# Patient Record
Sex: Female | Born: 1965 | Race: White | Hispanic: No | Marital: Married
Health system: Southern US, Community
[De-identification: ages and names within clinical notes are randomized; demographics above are authoritative.]

---

## 2005-06-15 ENCOUNTER — Ambulatory Visit: Payer: Self-pay | Admitting: Family Medicine

## 2006-05-24 ENCOUNTER — Ambulatory Visit: Payer: Self-pay | Admitting: Obstetrics and Gynecology

## 2006-06-02 ENCOUNTER — Ambulatory Visit: Payer: Self-pay | Admitting: Obstetrics and Gynecology

## 2006-07-05 ENCOUNTER — Ambulatory Visit: Payer: Self-pay | Admitting: Obstetrics and Gynecology

## 2007-07-09 ENCOUNTER — Ambulatory Visit: Payer: Self-pay | Admitting: Obstetrics and Gynecology

## 2008-08-20 ENCOUNTER — Ambulatory Visit: Payer: Self-pay | Admitting: Obstetrics and Gynecology

## 2009-08-26 ENCOUNTER — Ambulatory Visit: Payer: Self-pay | Admitting: Obstetrics and Gynecology

## 2010-10-27 ENCOUNTER — Ambulatory Visit: Payer: Self-pay | Admitting: Obstetrics and Gynecology

## 2011-12-06 ENCOUNTER — Ambulatory Visit: Payer: Self-pay | Admitting: Obstetrics and Gynecology

## 2012-12-06 ENCOUNTER — Ambulatory Visit: Payer: Self-pay | Admitting: Obstetrics and Gynecology

## 2013-12-19 ENCOUNTER — Ambulatory Visit: Payer: Self-pay | Admitting: Obstetrics and Gynecology

## 2014-09-26 ENCOUNTER — Other Ambulatory Visit: Payer: Self-pay | Admitting: Obstetrics and Gynecology

## 2014-09-26 DIAGNOSIS — Z1239 Encounter for other screening for malignant neoplasm of breast: Secondary | ICD-10-CM

## 2014-12-23 ENCOUNTER — Ambulatory Visit
Admission: RE | Admit: 2014-12-23 | Discharge: 2014-12-23 | Disposition: A | Discharge status: Home / Self Care | Payer: BLUE CROSS/BLUE SHIELD | Source: Ambulatory Visit | Attending: Obstetrics and Gynecology | Admitting: Obstetrics and Gynecology

## 2014-12-23 DIAGNOSIS — Z1231 Encounter for screening mammogram for malignant neoplasm of breast: Secondary | ICD-10-CM | POA: Diagnosis not present

## 2014-12-23 DIAGNOSIS — Z1239 Encounter for other screening for malignant neoplasm of breast: Secondary | ICD-10-CM

## 2015-10-06 ENCOUNTER — Other Ambulatory Visit: Payer: Self-pay | Admitting: Obstetrics and Gynecology

## 2015-10-06 DIAGNOSIS — Z1231 Encounter for screening mammogram for malignant neoplasm of breast: Secondary | ICD-10-CM

## 2015-12-24 ENCOUNTER — Ambulatory Visit
Admission: RE | Admit: 2015-12-24 | Discharge: 2015-12-24 | Disposition: A | Discharge status: Home / Self Care | Payer: BLUE CROSS/BLUE SHIELD | Source: Ambulatory Visit | Attending: Obstetrics and Gynecology | Admitting: Obstetrics and Gynecology

## 2015-12-24 DIAGNOSIS — Z1231 Encounter for screening mammogram for malignant neoplasm of breast: Secondary | ICD-10-CM | POA: Insufficient documentation

## 2016-11-01 ENCOUNTER — Other Ambulatory Visit: Payer: Self-pay | Admitting: Obstetrics and Gynecology

## 2016-11-01 DIAGNOSIS — Z1231 Encounter for screening mammogram for malignant neoplasm of breast: Secondary | ICD-10-CM

## 2016-12-26 ENCOUNTER — Ambulatory Visit
Admission: RE | Admit: 2016-12-26 | Discharge: 2016-12-26 | Disposition: A | Discharge status: Home / Self Care | Payer: BLUE CROSS/BLUE SHIELD | Source: Ambulatory Visit | Attending: Obstetrics and Gynecology | Admitting: Obstetrics and Gynecology

## 2016-12-26 DIAGNOSIS — Z1231 Encounter for screening mammogram for malignant neoplasm of breast: Secondary | ICD-10-CM | POA: Diagnosis present

## 2017-11-07 ENCOUNTER — Other Ambulatory Visit: Payer: Self-pay | Admitting: Obstetrics and Gynecology

## 2017-11-07 DIAGNOSIS — Z1231 Encounter for screening mammogram for malignant neoplasm of breast: Secondary | ICD-10-CM

## 2017-12-28 ENCOUNTER — Ambulatory Visit
Admission: RE | Admit: 2017-12-28 | Discharge: 2017-12-28 | Disposition: A | Discharge status: Home / Self Care | Payer: BLUE CROSS/BLUE SHIELD | Source: Ambulatory Visit | Attending: Obstetrics and Gynecology | Admitting: Obstetrics and Gynecology

## 2017-12-28 DIAGNOSIS — Z1231 Encounter for screening mammogram for malignant neoplasm of breast: Secondary | ICD-10-CM | POA: Diagnosis present

## 2018-11-13 ENCOUNTER — Other Ambulatory Visit: Payer: Self-pay | Admitting: Obstetrics and Gynecology

## 2018-11-13 DIAGNOSIS — Z1231 Encounter for screening mammogram for malignant neoplasm of breast: Secondary | ICD-10-CM

## 2019-12-10 ENCOUNTER — Other Ambulatory Visit: Payer: Self-pay

## 2019-12-10 ENCOUNTER — Ambulatory Visit
Admission: RE | Admit: 2019-12-10 | Discharge: 2019-12-10 | Disposition: A | Discharge status: Home / Self Care | Payer: BC Managed Care – PPO | Source: Ambulatory Visit | Attending: Obstetrics and Gynecology | Admitting: Obstetrics and Gynecology

## 2019-12-10 DIAGNOSIS — Z1231 Encounter for screening mammogram for malignant neoplasm of breast: Secondary | ICD-10-CM | POA: Insufficient documentation

## 2020-12-09 ENCOUNTER — Other Ambulatory Visit: Payer: Self-pay | Admitting: Obstetrics and Gynecology

## 2020-12-09 DIAGNOSIS — Z1231 Encounter for screening mammogram for malignant neoplasm of breast: Secondary | ICD-10-CM

## 2020-12-25 ENCOUNTER — Ambulatory Visit
Admission: RE | Admit: 2020-12-25 | Discharge: 2020-12-25 | Disposition: A | Discharge status: Home / Self Care | Payer: BC Managed Care – PPO | Source: Ambulatory Visit | Attending: Obstetrics and Gynecology | Admitting: Obstetrics and Gynecology

## 2020-12-25 ENCOUNTER — Other Ambulatory Visit: Payer: Self-pay

## 2020-12-25 DIAGNOSIS — Z1231 Encounter for screening mammogram for malignant neoplasm of breast: Secondary | ICD-10-CM | POA: Insufficient documentation

## 2021-12-20 ENCOUNTER — Emergency Department: Payer: BC Managed Care – PPO

## 2021-12-20 ENCOUNTER — Other Ambulatory Visit: Payer: Self-pay

## 2021-12-20 DIAGNOSIS — R42 Dizziness and giddiness: Secondary | ICD-10-CM | POA: Insufficient documentation

## 2021-12-20 LAB — URINALYSIS, ROUTINE W REFLEX MICROSCOPIC
Bilirubin Urine: NEGATIVE
Glucose, UA: NEGATIVE mg/dL
Hgb urine dipstick: NEGATIVE
Ketones, ur: 5 mg/dL — AB
Leukocytes,Ua: NEGATIVE
Nitrite: NEGATIVE
Protein, ur: NEGATIVE mg/dL
Specific Gravity, Urine: 1.02 (ref 1.005–1.030)
pH: 5 (ref 5.0–8.0)

## 2021-12-20 LAB — BASIC METABOLIC PANEL
Anion gap: 7 (ref 5–15)
BUN: 13 mg/dL (ref 6–20)
CO2: 27 mmol/L (ref 22–32)
Calcium: 9.2 mg/dL (ref 8.9–10.3)
Chloride: 103 mmol/L (ref 98–111)
Creatinine, Ser: 0.64 mg/dL (ref 0.44–1.00)
GFR, Estimated: >60 mL/min (ref >60)
Glucose, Bld: 120 mg/dL — ABNORMAL HIGH (ref 70–99)
Potassium: 3.4 mmol/L — ABNORMAL LOW (ref 3.5–5.1)
Sodium: 137 mmol/L (ref 135–145)

## 2021-12-20 LAB — CBC
HCT: 37.9 % (ref 36.0–46.0)
Hemoglobin: 12.4 g/dL (ref 12.0–15.0)
MCH: 28.6 pg (ref 26.0–34.0)
MCHC: 32.7 g/dL (ref 30.0–36.0)
MCV: 87.3 fL (ref 80.0–100.0)
Platelets: 317 10*3/uL (ref 150–400)
RBC: 4.34 MIL/uL (ref 3.87–5.11)
RDW: 12.4 % (ref 11.5–15.5)
WBC: 12.6 10*3/uL — ABNORMAL HIGH (ref 4.0–10.5)
nRBC: 0 % (ref 0.0–0.2)

## 2021-12-20 LAB — TROPONIN I (HIGH SENSITIVITY)
Troponin I (High Sensitivity): 4 ng/L (ref <18)
Troponin I (High Sensitivity): 4 ng/L (ref <18)

## 2021-12-20 MED ORDER — ONDANSETRON HCL 4 MG/2ML IJ SOLN: 4.0000 mg | Freq: Once | INTRAMUSCULAR | Status: DC

## 2021-12-20 NOTE — ED Triage Notes (Signed)
Pt arrives with c/o dizziness that started earlier this evening. Per pt, after the dizziness started she had an episode of bloody vomit. Pt denies CP or SOB.

## 2021-12-20 NOTE — ED Provider Triage Note (Signed)
  Emergency Medicine Provider Triage Evaluation Note  Brandi Barnes , a 56 y.o.female,  was evaluated in triage.  Pt complains of dizziness.  Patient reports a sudden onset of room spinning sensation that started earlier this evening, associated with nausea/vomiting.  She does state that over episodes of, appeared to have blood in.  She states that this is never happened before.  Denies any significant pain at this time.   Review of Systems  Positive: Dizziness, nausea/vomiting Negative: Denies fever, chest pain, abdominal pain  Physical Exam   Vitals:   12/20/21 1923  BP: (!) 177/92  Pulse: 80  Resp: 19  Temp: 97.7 F (36.5 C)  SpO2: 99%   Gen:   Awake, appears distressed. Resp:  Normal effort  MSK:   Moves extremities without difficulty  Other:    Medical Decision Making  Given the patient's initial medical screening exam, the following diagnostic evaluation has been ordered. The patient will be placed in the appropriate treatment space, once one is available, to complete the evaluation and treatment. I have discussed the plan of care with the patient and I have advised the patient that an ED physician or mid-level practitioner will reevaluate their condition after the test results have been received, as the results may give them additional insight into the type of treatment they may need.    Diagnostics: Labs, head CT, urinalysis, EKG.  Treatments: none immediately   Teodoro Spray, [place] 12/20/21 1939

## 2021-12-20 NOTE — ED Notes (Signed)
Pt unable to give urine sample at this time.  Pt has cup and is aware of the need for one

## 2021-12-21 ENCOUNTER — Emergency Department
Admission: EM | Admit: 2021-12-21 | Discharge: 2021-12-21 | Disposition: A | Discharge status: Home / Self Care | Payer: BC Managed Care – PPO | Attending: Emergency Medicine | Admitting: Emergency Medicine

## 2021-12-21 DIAGNOSIS — R42 Dizziness and giddiness: Secondary | ICD-10-CM

## 2021-12-21 MED ORDER — MECLIZINE HCL 25 MG PO TABS: 25.0000 mg | ORAL_TABLET | Freq: Three times a day (TID) | ORAL | 0 refills | Status: AC | PRN

## 2021-12-21 MED ORDER — MECLIZINE HCL 25 MG PO TABS
50.0000 mg | ORAL_TABLET | Freq: Once | ORAL | Status: AC
  Administered 2021-12-21: 50 mg via ORAL
  Filled 2021-12-21: qty 2

## 2021-12-21 MED ORDER — ONDANSETRON 4 MG PO TBDP: 4.0000 mg | ORAL_TABLET | Freq: Four times a day (QID) | ORAL | 0 refills | Status: AC | PRN

## 2021-12-21 MED ORDER — SODIUM CHLORIDE 0.9 % IV BOLUS (SEPSIS)
1000.0000 mL | Freq: Once | INTRAVENOUS | Status: AC
  Administered 2021-12-21: 1000 mL via INTRAVENOUS

## 2021-12-21 NOTE — ED Notes (Signed)
Pt c/o dizziness that started this evening. Pt states when she gets up the room is spinning.

## 2021-12-21 NOTE — ED Notes (Signed)
Pt verbalized understanding of discharge instructions, prescriptions, and follow-up care instructions. Pt advised if symptoms worsen to return to ED. E-signature not available due to signature pad not working.  

## 2021-12-21 NOTE — ED Provider Notes (Signed)
[facility] Provider Note    Event Date/Time   First MD Initiated Contact with Patient 12/21/21 0122     (approximate)   History   Dizziness   HPI  Brandi Barnes is a 56 y.o. female with no significant past medical history who presents to the emergency department with vertigo that started tonight while talking to her daughter.  Symptoms worse with standing up but feels like the room is spinning and she feels unsteady on her feet.  She is having some headache but no head injury.  She did have several episodes of vomiting and started seeing streaks of blood in her vomit but not vomiting any clots and not on blood thinners.  No numbness, tingling or weakness.  No ear pain, tinnitus or hearing loss.  Has never had vertigo before.  No history of previous stroke.   History provided by patient, husband, daughter.    History reviewed. No pertinent past medical history.  History reviewed. No pertinent surgical history.  MEDICATIONS:  Prior to Admission medications   Not on File    Physical Exam   Triage Vital Signs: ED Triage Vitals  Enc Vitals Group     BP 12/20/21 1923 (!) 177/92     Pulse Rate 12/20/21 1923 80     Resp 12/20/21 1923 19     Temp 12/20/21 1923 97.7 F (36.5 C)     Temp Source 12/20/21 1923 Oral     SpO2 12/20/21 1923 99 %     Weight 12/20/21 1925 185 lb (83.9 kg)     Height 12/20/21 1925 5\' 4"  (1.626 m)     Head Circumference --      Peak Flow --      Pain Score 12/20/21 1925 0     Pain Loc --      Pain Edu? --      Excl. in GC? --     Most recent vital signs: Vitals:   12/21/21 0200 12/21/21 0307  BP: (!) 151/86 (!) 158/79  Pulse: 78 65  Resp: 16 16  Temp:  97.8 F (36.6 C)  SpO2: 100% 100%    CONSTITUTIONAL: Alert and oriented and responds appropriately to questions. Well-appearing; well-nourished HEAD: Normocephalic, atraumatic EYES: Conjunctivae clear, pupils appear equal, sclera nonicteric ENT: normal nose;  moist mucous membranes; TMs are clear bilaterally without erythema, purulence, bulging, perforation, effusion.  No cerumen impaction or sign of foreign body in the external auditory canal. No inflammation, erythema or drainage from the external auditory canal. No signs of mastoiditis. No pain with manipulation of the pinna bilaterally. NECK: Supple, normal ROM CARD: RRR; S1 and S2 appreciated; no murmurs, no clicks, no rubs, no gallops RESP: Normal chest excursion without splinting or tachypnea; breath sounds clear and equal bilaterally; no wheezes, no rhonchi, no rales, no hypoxia or respiratory distress, speaking full sentences ABD/GI: Normal bowel sounds; non-distended; soft, non-tender, no rebound, no guarding, no peritoneal signs BACK: The back appears normal EXT: Normal ROM in all joints; no deformity noted, no edema; no cyanosis SKIN: Normal color for age and race; warm; no rash on exposed skin NEURO: Moves all extremities equally, normal speech, no dysmetria to finger-nose testing, no drift, sensation to light touch intact diffusely, cranial nerves II through XII intact PSYCH: The patient's mood and manner are appropriate.   ED Results / Procedures / Treatments   LABS: (all labs ordered are listed, but only abnormal results are displayed) Labs Reviewed  BASIC METABOLIC  PANEL - Abnormal; Notable for the following components:      Result Value   Potassium 3.4 (*)    Glucose, Bld 120 (*)    All other components within normal limits  CBC - Abnormal; Notable for the following components:   WBC 12.6 (*)    All other components within normal limits  URINALYSIS, ROUTINE W REFLEX MICROSCOPIC - Abnormal; Notable for the following components:   Color, Urine YELLOW (*)    APPearance CLEAR (*)    Ketones, ur 5 (*)    All other components within normal limits  TROPONIN I (HIGH SENSITIVITY)  TROPONIN I (HIGH SENSITIVITY)     EKG:  EKG Interpretation  Date/Time:  Monday December 20 2021  19:30:55 EDT Ventricular Rate:  76 PR Interval:  130 QRS Duration: 86 QT Interval:  398 QTC Calculation: 447 R Axis:   50 Text Interpretation: Normal sinus rhythm Normal ECG No previous ECGs available Confirmed by Pryor Curia [phone]) on 12/21/2021 1:26:21 AM         RADIOLOGY: My personal review and interpretation of imaging: CT head unremarkable.  Chest x-ray clear.  I have personally reviewed all radiology reports.   DG Chest 2 View  Result Date: 12/20/2021 CLINICAL DATA:  Hematemesis EXAM: CHEST - 2 VIEW COMPARISON:  None Available. FINDINGS: Lungs are clear.  No pleural effusion or pneumothorax. The heart is normal in size. Visualized osseous structures are within normal limits. IMPRESSION: Normal chest radiographs. Electronically Signed   By: Julian Hy M.D.   On: 12/20/2021 23:29   CT Head Wo Contrast  Result Date: 12/20/2021 CLINICAL DATA:  Dizziness EXAM: CT HEAD WITHOUT CONTRAST TECHNIQUE: Contiguous axial images were obtained from the base of the skull through the vertex without intravenous contrast. RADIATION DOSE REDUCTION: This exam was performed according to the departmental dose-optimization program which includes automated exposure control, adjustment of the mA and/or kV according to patient size and/or use of iterative reconstruction technique. COMPARISON:  None Available. FINDINGS: Brain: No evidence of acute infarction, hemorrhage, mass, mass effect, or midline shift. No hydrocephalus or extra-axial fluid collection. Vascular: No hyperdense vessel. Skull: Normal. Negative for fracture or focal lesion. Sinuses/Orbits: No acute finding. Other: The mastoid air cells are well aerated. IMPRESSION: No acute intracranial process. Electronically Signed   By: Merilyn Baba M.D.   On: 12/20/2021 19:50     PROCEDURES:  Critical Care performed: No    Procedures    IMPRESSION / MDM / ASSESSMENT AND PLAN / ED COURSE  I reviewed the triage vital signs and the  nursing notes.    Patient here with vertigo without other focal neurologic deficits.     DIFFERENTIAL DIAGNOSIS (includes but not limited to):   SPECT peripheral vertigo.  Low suspicion for CVA given no significant risk factors.  Doubt intracranial hemorrhage, mass.  Differential also includes electrolyte derangement, symptomatic anemia but doubt ACS, arrhythmia.  Will check orthostats.   Patient's presentation is most consistent with acute presentation with potential threat to life or bodily function.   PLAN: Patient's labs show slight leukocytosis but normal hemoglobin, electrolytes, renal function, troponin x2 negative.  Urine shows no sign of infection or significant dehydration.  CT head and chest x-ray obtained from triage and reviewed and interpreted by myself and the radiologist and show no acute abnormality.  EKG shows no ischemia, arrhythmia or interval abnormality.  We will give IV fluids, meclizine and reassess.   MEDICATIONS GIVEN IN ED: Medications  ondansetron (ZOFRAN) injection 4  mg (has no administration in time range)  sodium chloride 0.9 % bolus 1,000 mL (0 mLs Intravenous Stopped 12/21/21 0309)  meclizine (ANTIVERT) tablet 50 mg (50 mg Oral Given 12/21/21 0201)     ED COURSE: Patient reports feeling better.  Tolerating p.o. here.  Suspect her episodes of hematemesis were from Mallory-Weiss tears.  No history of alcohol abuse, esophageal varices and no abdominal pain and not on antiplatelets or anticoagulants.  She was able to ambulate without assistance here.  Will discharge with prescription of meclizine.  Again low suspicion for CVA and do not feel at this time she needs an MRI of her brain.  NIH stroke scale is 0.  She has a PCP for follow-up if needed.  Will discharge with meclizine, Zofran for home.   At this time, I do not feel there is any life-threatening condition present. I reviewed all nursing notes, vitals, pertinent previous records.  All lab and urine  results, EKGs, imaging ordered have been independently reviewed and interpreted by myself.  I reviewed all available radiology reports from any imaging ordered this visit.  Based on my assessment, I feel the patient is safe to be discharged home without further emergent workup and can continue workup as an outpatient as needed. Discussed all findings, treatment plan as well as usual and customary return precautions.  They verbalize understanding and are comfortable with this plan.  Outpatient follow-up has been provided as needed.  All questions have been answered.    CONSULTS: Admission considered but patient feeling better and work-up is reassuring.  Appropriate for outpatient management.   OUTSIDE RECORDS REVIEWED: Reviewed patient's last well woman exam with Dr. Feliberto Gottron on 11/16/2020.       FINAL CLINICAL IMPRESSION(S) / ED DIAGNOSES   Final diagnoses:  Vertigo     Rx / DC Orders   ED Discharge Orders          Ordered    meclizine (ANTIVERT) 25 MG tablet  3 times daily PRN        12/21/21 0300    ondansetron (ZOFRAN-ODT) 4 MG disintegrating tablet  Every 6 hours PRN        12/21/21 0300             Note:  This document was prepared using Dragon voice recognition software and may include unintentional dictation errors.   Haydee Jabbour, Layla Maw, DO 12/21/21 [phone]

## 2021-12-21 NOTE — ED Notes (Signed)
Swelling went down at site of IV infiltration.

## 2021-12-21 NOTE — ED Notes (Signed)
Pt ambulated with steady gait and dizziness has gotten better.

## 2022-02-15 ENCOUNTER — Other Ambulatory Visit: Payer: Self-pay | Admitting: Family Medicine

## 2022-02-15 DIAGNOSIS — Z1231 Encounter for screening mammogram for malignant neoplasm of breast: Secondary | ICD-10-CM

## 2022-03-15 ENCOUNTER — Ambulatory Visit
Admission: RE | Admit: 2022-03-15 | Discharge: 2022-03-15 | Disposition: A | Discharge status: Home / Self Care | Payer: BC Managed Care – PPO | Source: Ambulatory Visit | Attending: Family Medicine | Admitting: Family Medicine

## 2022-03-15 DIAGNOSIS — Z1231 Encounter for screening mammogram for malignant neoplasm of breast: Secondary | ICD-10-CM | POA: Insufficient documentation

## 2022-12-05 IMAGING — MG MM DIGITAL SCREENING BILAT W/ TOMO AND CAD
8 series · 8 of 24 positions shown · non-contrast
Comparison: Previous exam(s).

CLINICAL DATA: Screening.

EXAM:
DIGITAL SCREENING BILATERAL MAMMOGRAM WITH TOMOSYNTHESIS AND CAD
TECHNIQUE: Bilateral screening digital craniocaudal and mediolateral oblique
mammograms were obtained. Bilateral screening digital breast
tomosynthesis was performed. The images were evaluated with
computer-aided detection.

[R MLO synth-2D]
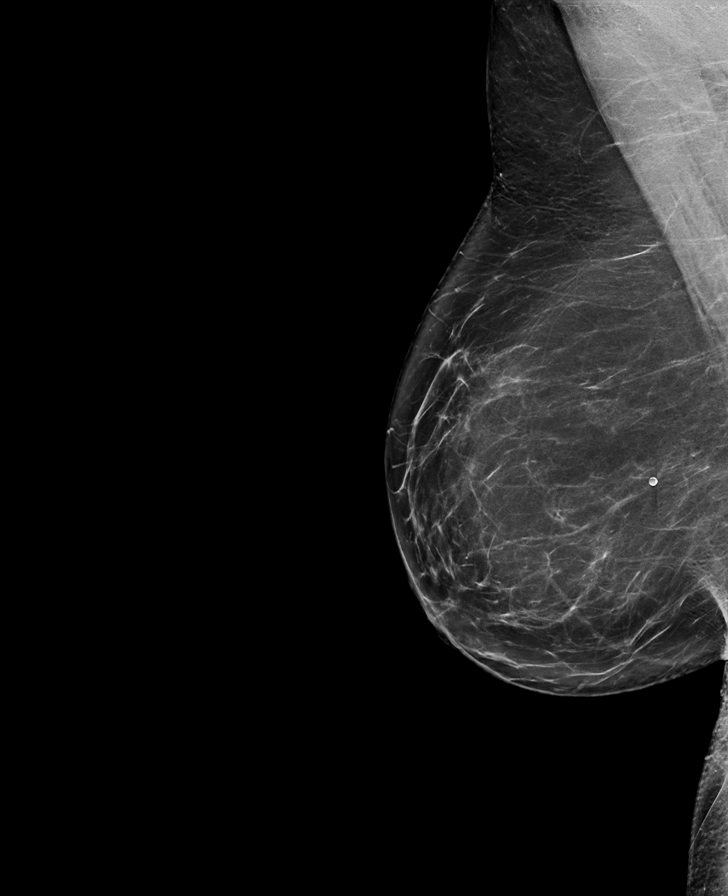

[L MLO synth-2D]
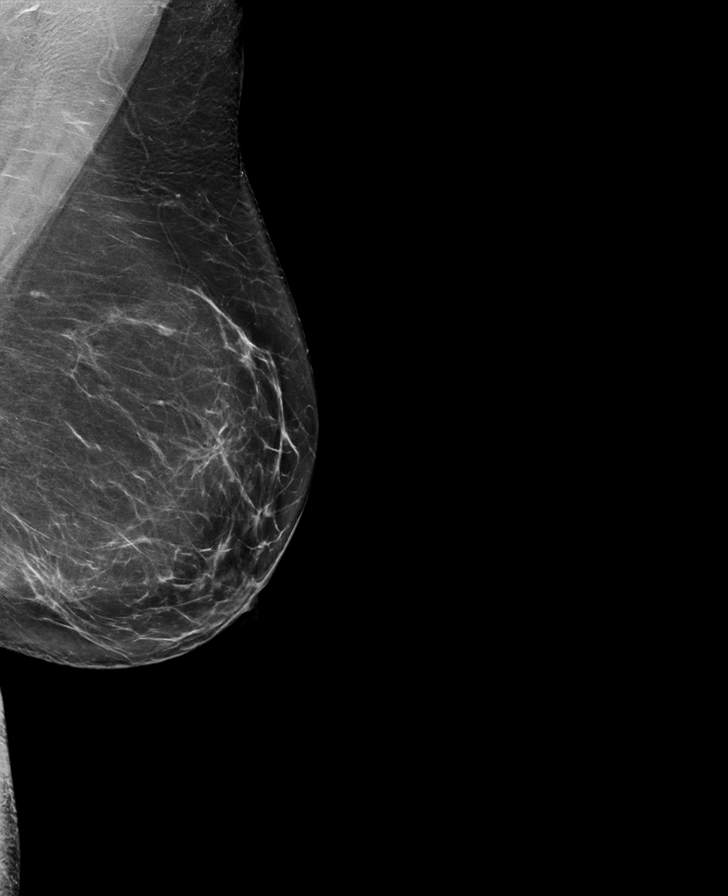

[R CC synth-2D]
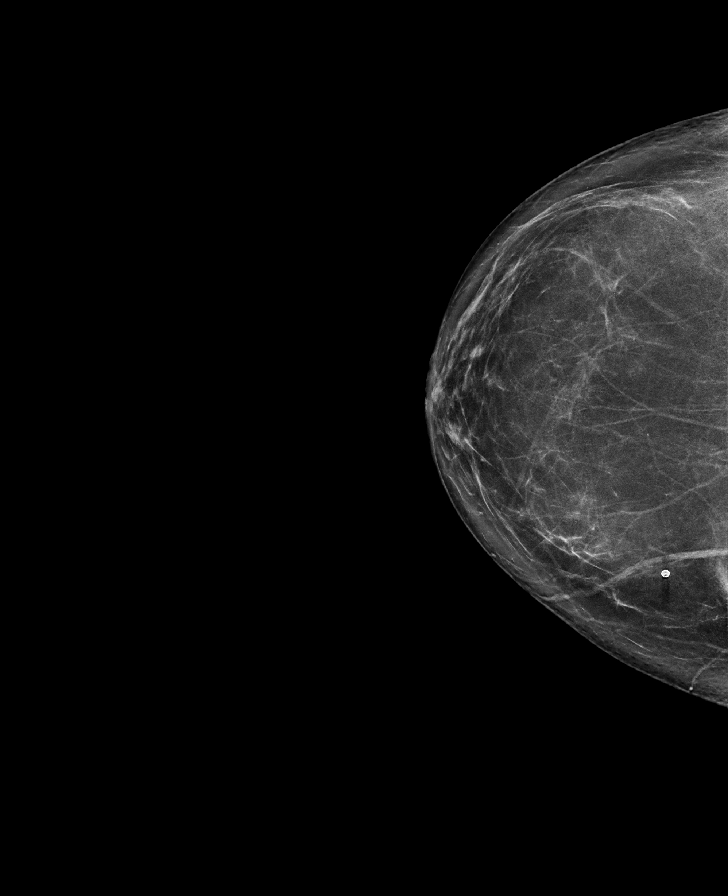

[L CC synth-2D]
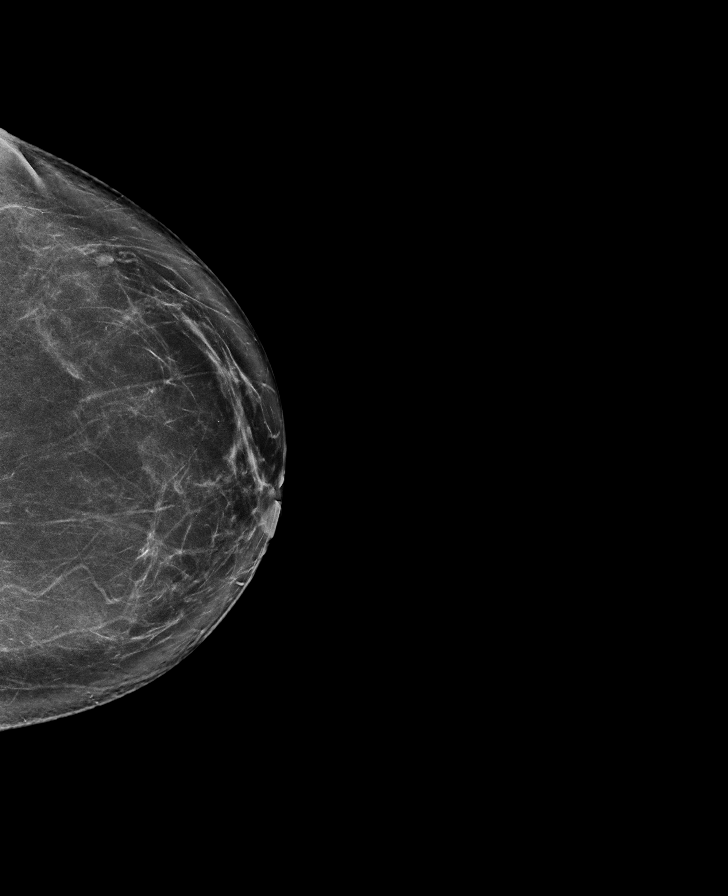

[L MLO tomo · tomo slice 44/87.0]
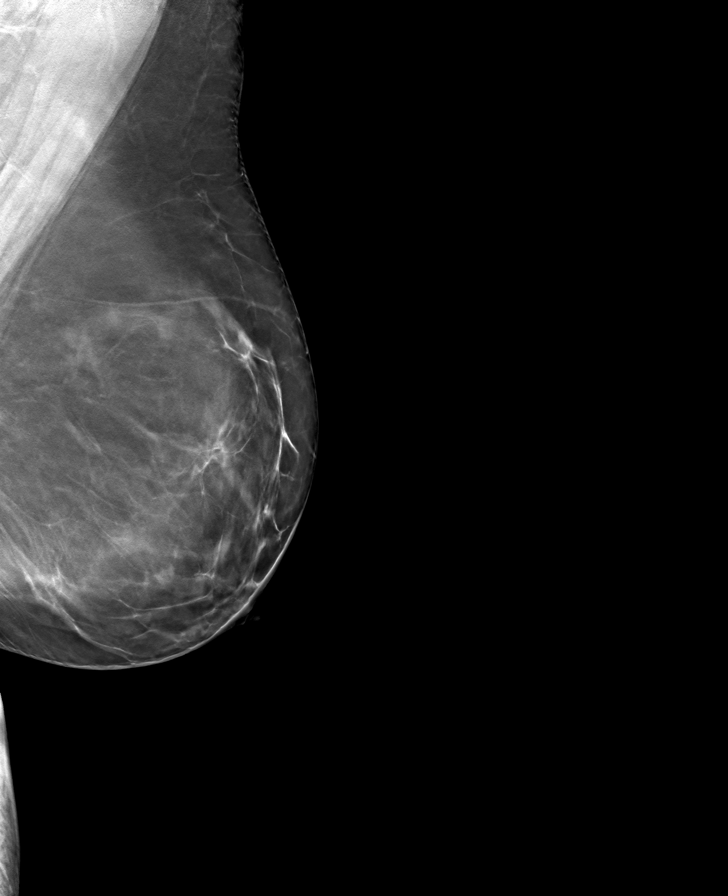

[R CC tomo · tomo slice 41/82.0]
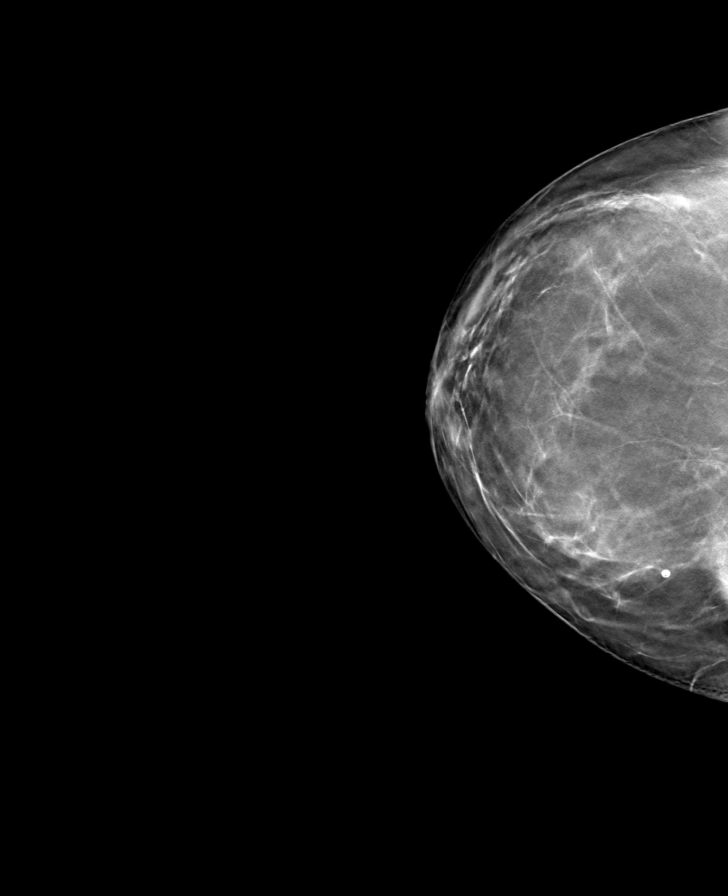

[R MLO tomo · tomo slice 45/88.0]
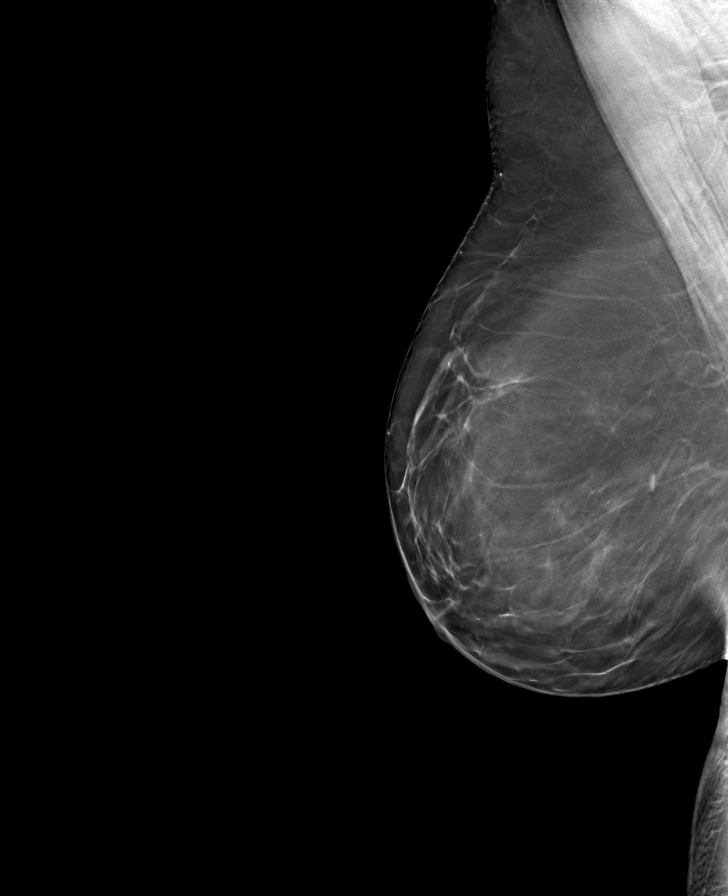

[L CC tomo · tomo slice 43/84.0]
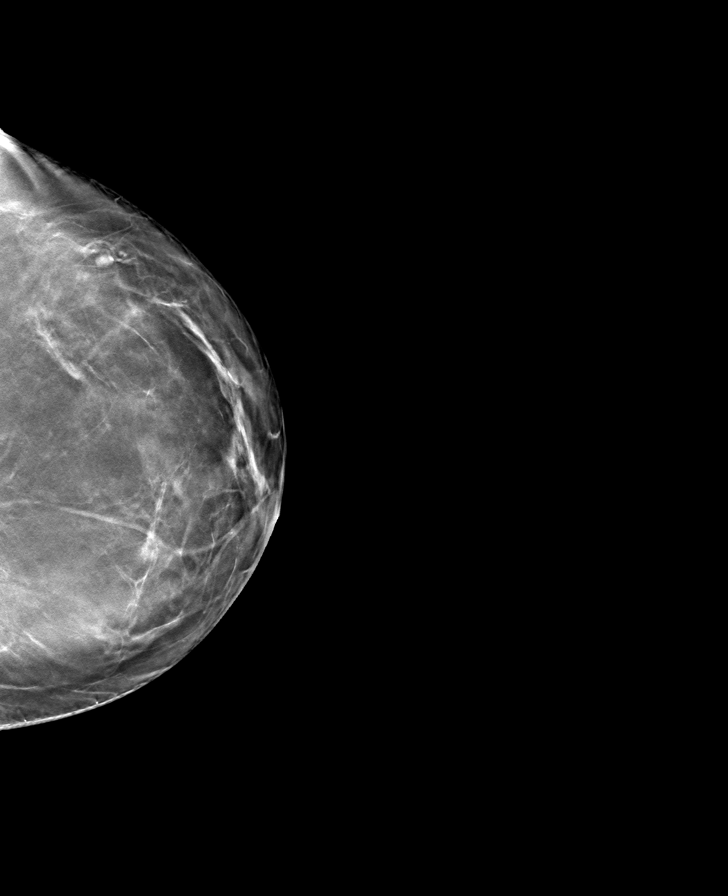

[8 of 24 positions shown; findings below may reference images not displayed]

ACR Breast Density Category b: There are scattered areas of
fibroglandular density.
FINDINGS: There are no findings suspicious for malignancy.
IMPRESSION: No mammographic evidence of malignancy. A result letter of this
screening mammogram will be mailed directly to the patient.

RECOMMENDATION:
Screening mammogram in one year. (Code:51-O-LD2)

BI-RADS CATEGORY  1: Negative.

## 2023-05-04 ENCOUNTER — Other Ambulatory Visit: Payer: Self-pay | Admitting: Family Medicine

## 2023-05-04 DIAGNOSIS — Z1231 Encounter for screening mammogram for malignant neoplasm of breast: Secondary | ICD-10-CM

## 2023-06-09 ENCOUNTER — Ambulatory Visit
Admission: RE | Admit: 2023-06-09 | Discharge: 2023-06-09 | Disposition: A | Discharge status: Home / Self Care | Source: Ambulatory Visit | Attending: Family Medicine | Admitting: Family Medicine

## 2023-06-09 DIAGNOSIS — Z1231 Encounter for screening mammogram for malignant neoplasm of breast: Secondary | ICD-10-CM | POA: Diagnosis present
